# Patient Record
Sex: Female | Born: 1945 | Race: White | Hispanic: No | Marital: Married | Smoking: Never smoker
Health system: Southern US, Community
[De-identification: ages and names within clinical notes are randomized; demographics above are authoritative.]

---

## 2019-06-11 DIAGNOSIS — Z20822 Contact with and (suspected) exposure to covid-19: Secondary | ICD-10-CM | POA: Diagnosis not present

## 2019-06-15 DIAGNOSIS — Z20822 Contact with and (suspected) exposure to covid-19: Secondary | ICD-10-CM | POA: Diagnosis not present

## 2019-06-15 DIAGNOSIS — B349 Viral infection, unspecified: Secondary | ICD-10-CM | POA: Diagnosis not present

## 2019-06-20 DIAGNOSIS — Z20822 Contact with and (suspected) exposure to covid-19: Secondary | ICD-10-CM | POA: Diagnosis not present

## 2019-06-20 DIAGNOSIS — U071 COVID-19: Secondary | ICD-10-CM | POA: Diagnosis not present

## 2019-07-03 DIAGNOSIS — N1832 Chronic kidney disease, stage 3b: Secondary | ICD-10-CM | POA: Diagnosis not present

## 2019-09-05 DIAGNOSIS — Z8639 Personal history of other endocrine, nutritional and metabolic disease: Secondary | ICD-10-CM | POA: Diagnosis not present

## 2019-09-05 DIAGNOSIS — R69 Illness, unspecified: Secondary | ICD-10-CM | POA: Diagnosis not present

## 2019-09-05 DIAGNOSIS — I10 Essential (primary) hypertension: Secondary | ICD-10-CM | POA: Diagnosis not present

## 2019-09-05 DIAGNOSIS — R7303 Prediabetes: Secondary | ICD-10-CM | POA: Diagnosis not present

## 2019-09-05 DIAGNOSIS — Z862 Personal history of diseases of the blood and blood-forming organs and certain disorders involving the immune mechanism: Secondary | ICD-10-CM | POA: Diagnosis not present

## 2019-09-05 DIAGNOSIS — Z7189 Other specified counseling: Secondary | ICD-10-CM | POA: Diagnosis not present

## 2019-09-16 DIAGNOSIS — H524 Presbyopia: Secondary | ICD-10-CM | POA: Diagnosis not present

## 2019-09-18 DIAGNOSIS — Z862 Personal history of diseases of the blood and blood-forming organs and certain disorders involving the immune mechanism: Secondary | ICD-10-CM | POA: Diagnosis not present

## 2019-09-18 DIAGNOSIS — E559 Vitamin D deficiency, unspecified: Secondary | ICD-10-CM | POA: Diagnosis not present

## 2019-09-18 DIAGNOSIS — R69 Illness, unspecified: Secondary | ICD-10-CM | POA: Diagnosis not present

## 2019-09-18 DIAGNOSIS — R7303 Prediabetes: Secondary | ICD-10-CM | POA: Diagnosis not present

## 2019-09-18 DIAGNOSIS — Z7189 Other specified counseling: Secondary | ICD-10-CM | POA: Diagnosis not present

## 2019-09-18 DIAGNOSIS — Z136 Encounter for screening for cardiovascular disorders: Secondary | ICD-10-CM | POA: Diagnosis not present

## 2019-09-18 DIAGNOSIS — I10 Essential (primary) hypertension: Secondary | ICD-10-CM | POA: Diagnosis not present

## 2019-10-29 ENCOUNTER — Other Ambulatory Visit: Payer: Self-pay | Admitting: Family Medicine

## 2019-10-29 DIAGNOSIS — Z1231 Encounter for screening mammogram for malignant neoplasm of breast: Secondary | ICD-10-CM

## 2019-11-07 ENCOUNTER — Ambulatory Visit: Payer: Self-pay

## 2019-11-27 DIAGNOSIS — R69 Illness, unspecified: Secondary | ICD-10-CM | POA: Diagnosis not present

## 2019-12-18 DIAGNOSIS — S20212A Contusion of left front wall of thorax, initial encounter: Secondary | ICD-10-CM | POA: Diagnosis not present

## 2020-02-05 ENCOUNTER — Ambulatory Visit: Payer: Self-pay

## 2020-02-14 ENCOUNTER — Other Ambulatory Visit: Payer: Self-pay

## 2020-02-14 ENCOUNTER — Ambulatory Visit
Admission: RE | Admit: 2020-02-14 | Discharge: 2020-02-14 | Disposition: A | Discharge status: Home / Self Care | Payer: Medicare HMO | Source: Ambulatory Visit | Attending: Family Medicine | Admitting: Family Medicine

## 2020-02-14 DIAGNOSIS — Z1231 Encounter for screening mammogram for malignant neoplasm of breast: Secondary | ICD-10-CM | POA: Diagnosis not present

## 2020-02-25 DIAGNOSIS — R69 Illness, unspecified: Secondary | ICD-10-CM | POA: Diagnosis not present

## 2020-02-26 DIAGNOSIS — R7303 Prediabetes: Secondary | ICD-10-CM | POA: Diagnosis not present

## 2020-02-26 DIAGNOSIS — E785 Hyperlipidemia, unspecified: Secondary | ICD-10-CM | POA: Diagnosis not present

## 2020-04-02 DIAGNOSIS — R69 Illness, unspecified: Secondary | ICD-10-CM | POA: Diagnosis not present

## 2020-04-14 DIAGNOSIS — R69 Illness, unspecified: Secondary | ICD-10-CM | POA: Diagnosis not present

## 2020-04-14 DIAGNOSIS — I10 Essential (primary) hypertension: Secondary | ICD-10-CM | POA: Diagnosis not present

## 2020-04-14 DIAGNOSIS — R7303 Prediabetes: Secondary | ICD-10-CM | POA: Diagnosis not present

## 2020-08-28 DIAGNOSIS — R69 Illness, unspecified: Secondary | ICD-10-CM | POA: Diagnosis not present

## 2020-08-28 DIAGNOSIS — R7303 Prediabetes: Secondary | ICD-10-CM | POA: Diagnosis not present

## 2020-08-28 DIAGNOSIS — I10 Essential (primary) hypertension: Secondary | ICD-10-CM | POA: Diagnosis not present

## 2020-09-28 DIAGNOSIS — Z0000 Encounter for general adult medical examination without abnormal findings: Secondary | ICD-10-CM | POA: Diagnosis not present

## 2020-09-28 DIAGNOSIS — Z1389 Encounter for screening for other disorder: Secondary | ICD-10-CM | POA: Diagnosis not present

## 2020-09-29 ENCOUNTER — Other Ambulatory Visit: Payer: Self-pay | Admitting: Family Medicine

## 2020-09-29 DIAGNOSIS — Z1231 Encounter for screening mammogram for malignant neoplasm of breast: Secondary | ICD-10-CM

## 2020-09-29 DIAGNOSIS — I10 Essential (primary) hypertension: Secondary | ICD-10-CM | POA: Diagnosis not present

## 2020-09-29 DIAGNOSIS — R7303 Prediabetes: Secondary | ICD-10-CM | POA: Diagnosis not present

## 2020-09-29 DIAGNOSIS — E2839 Other primary ovarian failure: Secondary | ICD-10-CM

## 2020-10-06 DIAGNOSIS — Z1211 Encounter for screening for malignant neoplasm of colon: Secondary | ICD-10-CM | POA: Diagnosis not present

## 2020-10-06 DIAGNOSIS — R69 Illness, unspecified: Secondary | ICD-10-CM | POA: Diagnosis not present

## 2020-10-06 DIAGNOSIS — R7303 Prediabetes: Secondary | ICD-10-CM | POA: Diagnosis not present

## 2020-10-06 DIAGNOSIS — I10 Essential (primary) hypertension: Secondary | ICD-10-CM | POA: Diagnosis not present

## 2020-10-06 DIAGNOSIS — K219 Gastro-esophageal reflux disease without esophagitis: Secondary | ICD-10-CM | POA: Diagnosis not present

## 2021-02-15 ENCOUNTER — Ambulatory Visit: Payer: Medicare HMO

## 2021-02-16 DIAGNOSIS — M542 Cervicalgia: Secondary | ICD-10-CM | POA: Diagnosis not present

## 2021-02-16 DIAGNOSIS — T148XXA Other injury of unspecified body region, initial encounter: Secondary | ICD-10-CM | POA: Diagnosis not present

## 2021-02-16 DIAGNOSIS — Z6824 Body mass index (BMI) 24.0-24.9, adult: Secondary | ICD-10-CM | POA: Diagnosis not present

## 2021-03-19 ENCOUNTER — Ambulatory Visit
Admission: RE | Admit: 2021-03-19 | Discharge: 2021-03-19 | Disposition: A | Discharge status: Home / Self Care | Payer: Medicare HMO | Source: Ambulatory Visit | Attending: Family Medicine | Admitting: Family Medicine

## 2021-03-19 DIAGNOSIS — Z1231 Encounter for screening mammogram for malignant neoplasm of breast: Secondary | ICD-10-CM | POA: Diagnosis not present

## 2021-04-21 DIAGNOSIS — I10 Essential (primary) hypertension: Secondary | ICD-10-CM | POA: Diagnosis not present

## 2021-04-21 DIAGNOSIS — R7303 Prediabetes: Secondary | ICD-10-CM | POA: Diagnosis not present

## 2021-04-21 DIAGNOSIS — R69 Illness, unspecified: Secondary | ICD-10-CM | POA: Diagnosis not present

## 2021-04-21 DIAGNOSIS — K219 Gastro-esophageal reflux disease without esophagitis: Secondary | ICD-10-CM | POA: Diagnosis not present

## 2021-07-20 DIAGNOSIS — H1045 Other chronic allergic conjunctivitis: Secondary | ICD-10-CM | POA: Diagnosis not present

## 2021-07-20 DIAGNOSIS — H16223 Keratoconjunctivitis sicca, not specified as Sjogren's, bilateral: Secondary | ICD-10-CM | POA: Diagnosis not present

## 2021-07-29 ENCOUNTER — Ambulatory Visit
Admission: RE | Admit: 2021-07-29 | Discharge: 2021-07-29 | Disposition: A | Discharge status: Home / Self Care | Payer: Medicare HMO | Source: Ambulatory Visit | Attending: Family Medicine | Admitting: Family Medicine

## 2021-07-29 DIAGNOSIS — Z780 Asymptomatic menopausal state: Secondary | ICD-10-CM | POA: Diagnosis not present

## 2021-07-29 DIAGNOSIS — E2839 Other primary ovarian failure: Secondary | ICD-10-CM

## 2021-07-29 DIAGNOSIS — M8589 Other specified disorders of bone density and structure, multiple sites: Secondary | ICD-10-CM | POA: Diagnosis not present

## 2021-08-04 DIAGNOSIS — M8580 Other specified disorders of bone density and structure, unspecified site: Secondary | ICD-10-CM | POA: Diagnosis not present

## 2021-08-04 DIAGNOSIS — K219 Gastro-esophageal reflux disease without esophagitis: Secondary | ICD-10-CM | POA: Diagnosis not present

## 2021-09-01 IMAGING — MG DIGITAL SCREENING BILAT W/ TOMO W/ CAD
8 series · 8 of 24 positions shown · non-contrast
Comparison: None.
COMPARISON: None.

Addendum:
CLINICAL DATA: Screening.

EXAM:
DIGITAL SCREENING BILATERAL MAMMOGRAM WITH TOMO AND CAD

[R MLO synth-2D]
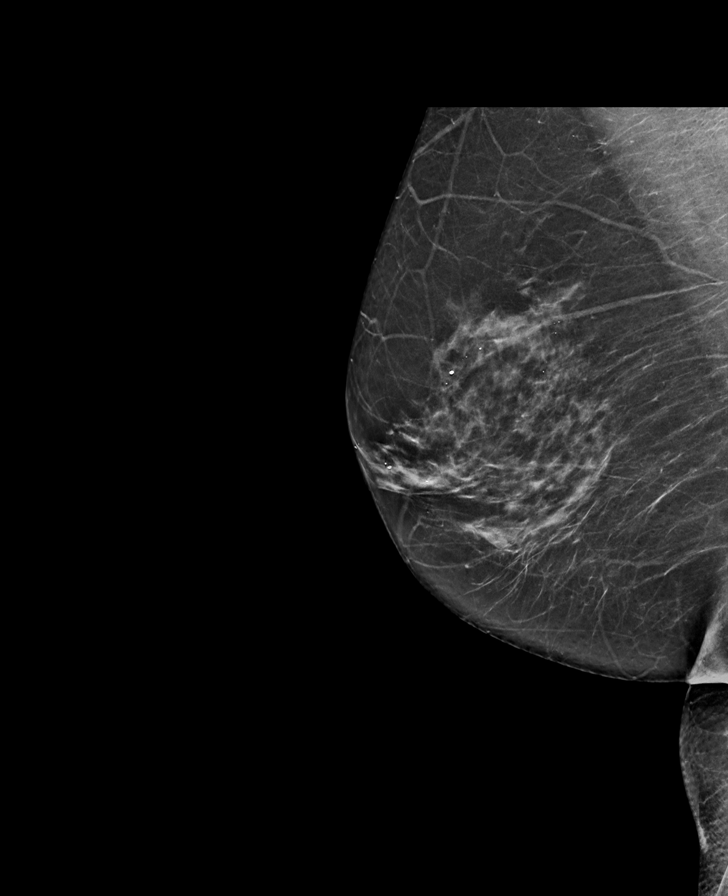

[L MLO synth-2D]
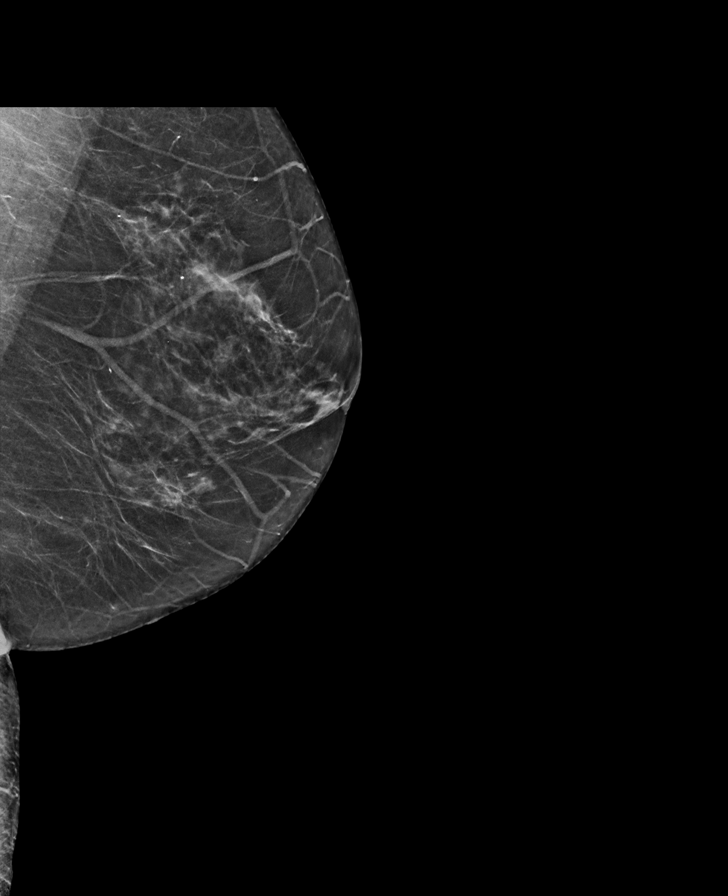

[L CC synth-2D]
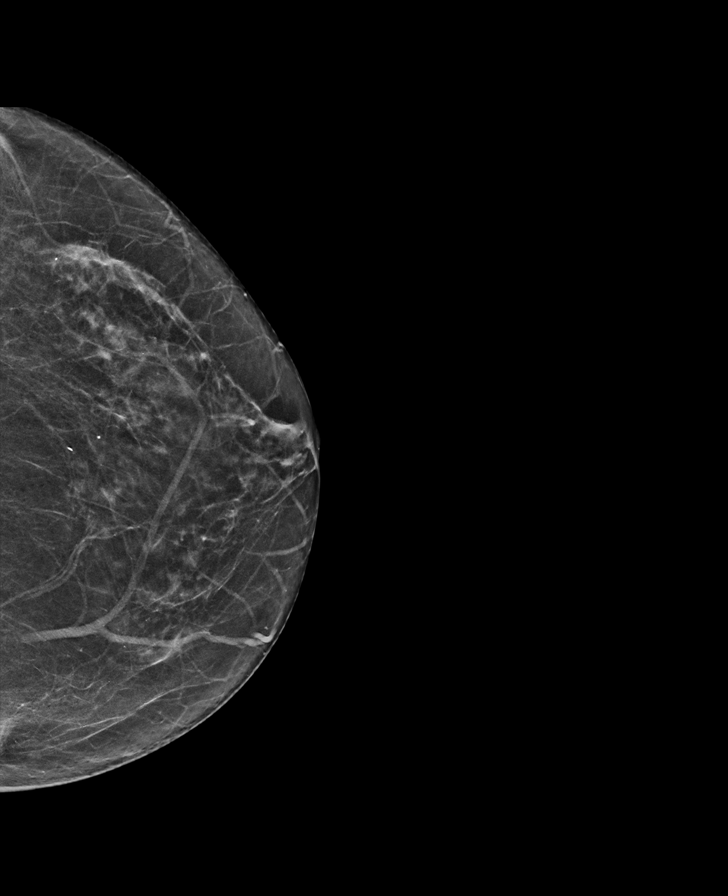

[R CC synth-2D]
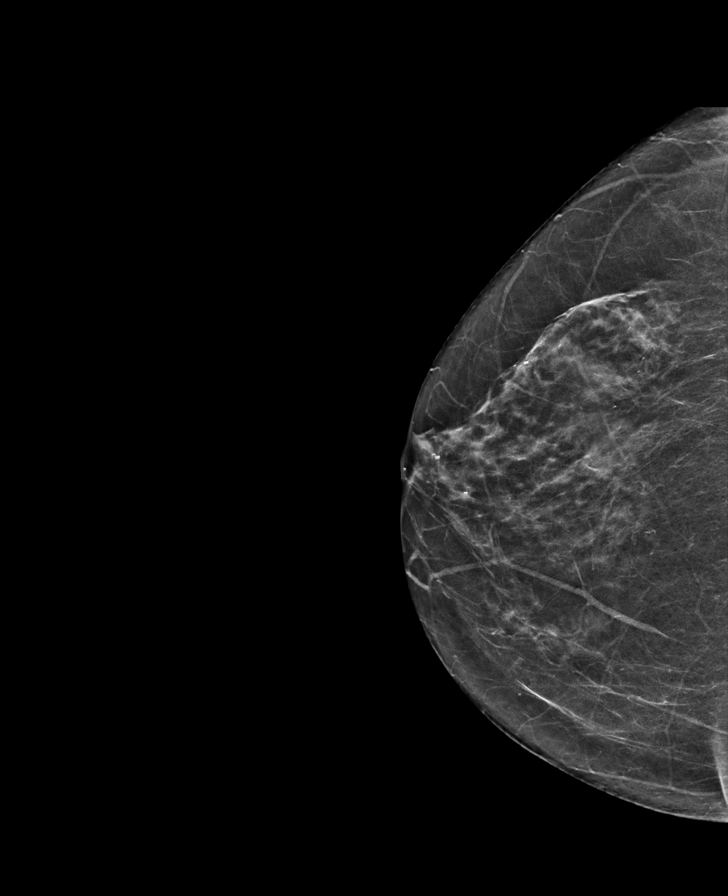

[L MLO tomo · tomo slice 33/66.0]
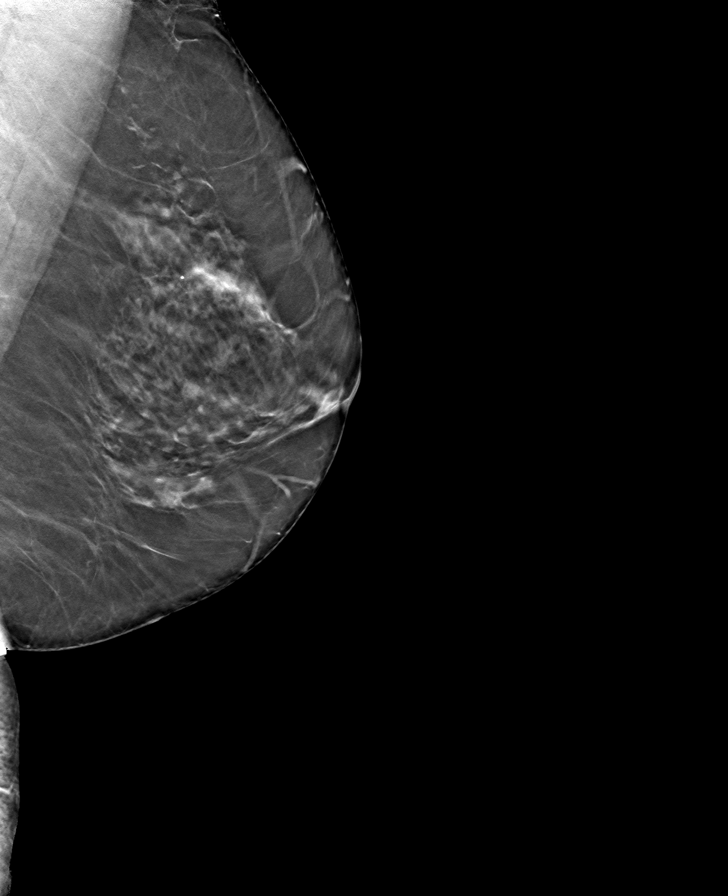

[L CC tomo · tomo slice 31/62.0]
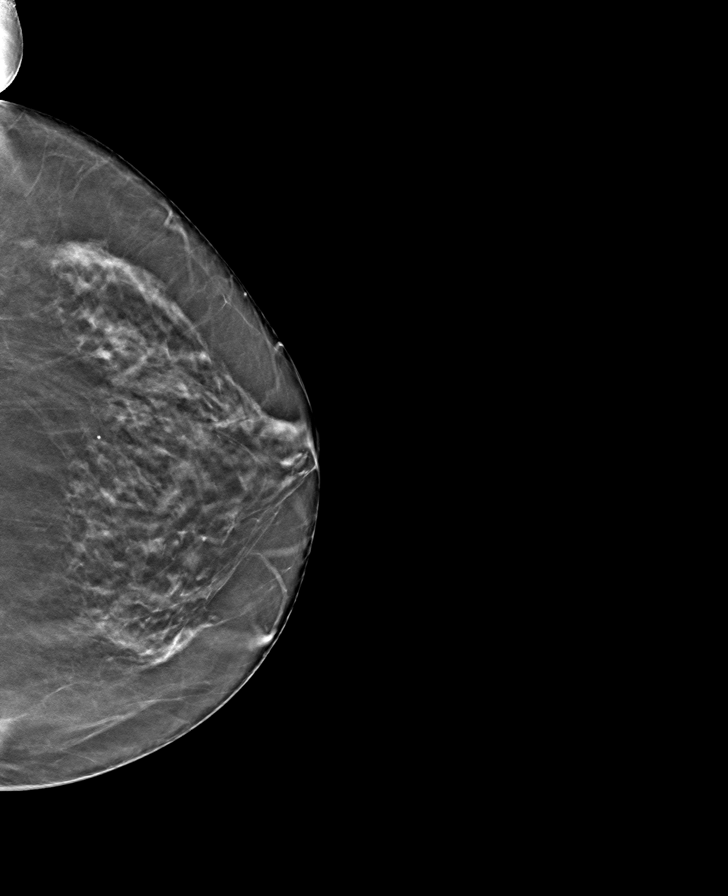

[R CC tomo · tomo slice 31/62.0]
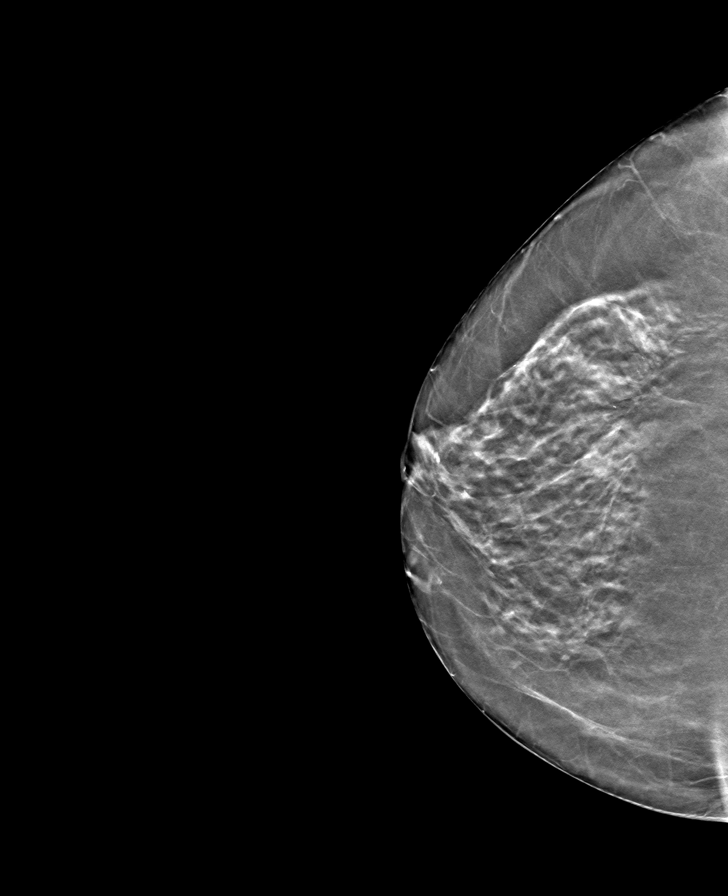

[R MLO tomo · tomo slice 37/72.0]
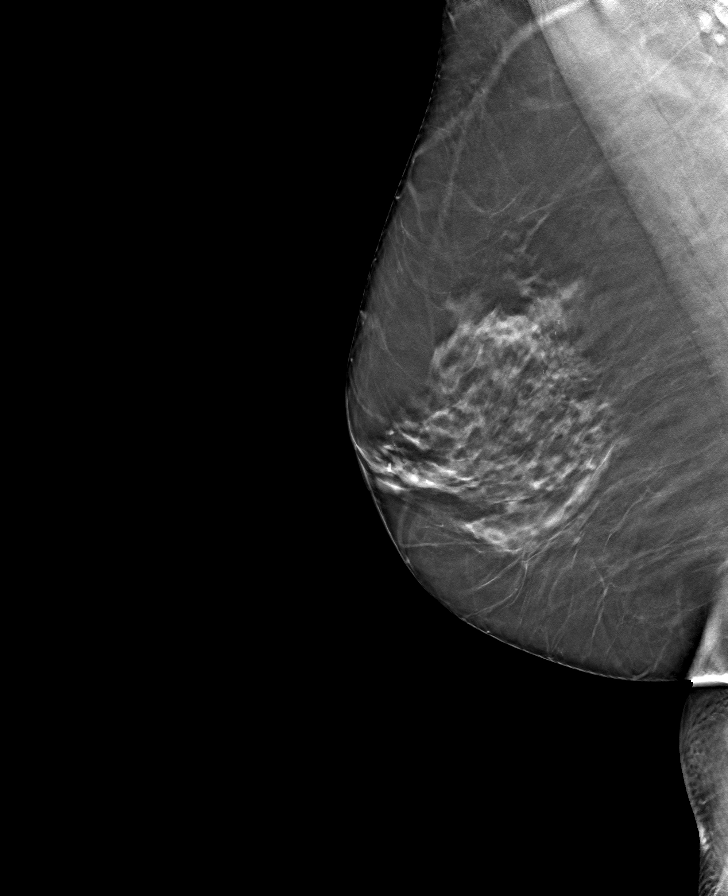

[8 of 24 positions shown; findings below may reference images not displayed]

ACR Breast Density Category b: There are scattered areas of
fibroglandular density.
FINDINGS: There are no findings suspicious for malignancy. Images were
processed with CAD.
IMPRESSION: No mammographic evidence of malignancy. A result letter of this
screening mammogram will be mailed directly to the patient.

RECOMMENDATION:
Screening mammogram in one year. (Code:Q2-V-KID)

BI-RADS CATEGORY  1: Negative.

ADDENDUM:
Prior outside mammograms from [REDACTED] dated 01/07/2019,
11/21/2016, and 11/07/2016 have become available for comparison.
When compared to these prior exams there has been no change in the
mammographic appearance of the bilateral breasts.

Recommend annual routine screening mammography.

BI-RADS category: 1: Negative.

*** End of Addendum ***
ACR Breast Density Category b: There are scattered areas of
fibroglandular density.
FINDINGS: There are no findings suspicious for malignancy. Images were
processed with CAD.
IMPRESSION: No mammographic evidence of malignancy. A result letter of this
screening mammogram will be mailed directly to the patient.

RECOMMENDATION:
Screening mammogram in one year. (Code:Q2-V-KID)

BI-RADS CATEGORY  1: Negative.

## 2021-10-07 DIAGNOSIS — K219 Gastro-esophageal reflux disease without esophagitis: Secondary | ICD-10-CM | POA: Diagnosis not present

## 2021-10-07 DIAGNOSIS — S161XXA Strain of muscle, fascia and tendon at neck level, initial encounter: Secondary | ICD-10-CM | POA: Diagnosis not present

## 2021-10-07 DIAGNOSIS — Z79899 Other long term (current) drug therapy: Secondary | ICD-10-CM | POA: Diagnosis not present

## 2021-10-07 DIAGNOSIS — I10 Essential (primary) hypertension: Secondary | ICD-10-CM | POA: Diagnosis not present

## 2021-10-07 DIAGNOSIS — M542 Cervicalgia: Secondary | ICD-10-CM | POA: Diagnosis not present

## 2021-10-07 DIAGNOSIS — R7303 Prediabetes: Secondary | ICD-10-CM | POA: Diagnosis not present

## 2021-10-07 DIAGNOSIS — E2839 Other primary ovarian failure: Secondary | ICD-10-CM | POA: Diagnosis not present

## 2021-10-07 DIAGNOSIS — E785 Hyperlipidemia, unspecified: Secondary | ICD-10-CM | POA: Diagnosis not present

## 2021-10-07 DIAGNOSIS — Z8639 Personal history of other endocrine, nutritional and metabolic disease: Secondary | ICD-10-CM | POA: Diagnosis not present

## 2021-10-07 DIAGNOSIS — R69 Illness, unspecified: Secondary | ICD-10-CM | POA: Diagnosis not present

## 2021-10-07 DIAGNOSIS — Z862 Personal history of diseases of the blood and blood-forming organs and certain disorders involving the immune mechanism: Secondary | ICD-10-CM | POA: Diagnosis not present

## 2021-10-15 DIAGNOSIS — E785 Hyperlipidemia, unspecified: Secondary | ICD-10-CM | POA: Diagnosis not present

## 2021-10-15 DIAGNOSIS — I10 Essential (primary) hypertension: Secondary | ICD-10-CM | POA: Diagnosis not present

## 2021-10-15 DIAGNOSIS — R7303 Prediabetes: Secondary | ICD-10-CM | POA: Diagnosis not present

## 2022-02-22 ENCOUNTER — Other Ambulatory Visit: Payer: Self-pay | Admitting: Family Medicine

## 2022-02-22 DIAGNOSIS — Z1231 Encounter for screening mammogram for malignant neoplasm of breast: Secondary | ICD-10-CM

## 2022-03-28 ENCOUNTER — Ambulatory Visit: Payer: Medicare HMO

## 2022-04-08 ENCOUNTER — Ambulatory Visit: Payer: Medicare HMO

## 2022-04-11 ENCOUNTER — Encounter (HOSPITAL_BASED_OUTPATIENT_CLINIC_OR_DEPARTMENT_OTHER): Payer: Self-pay | Admitting: Nurse Practitioner

## 2022-04-11 ENCOUNTER — Ambulatory Visit (INDEPENDENT_AMBULATORY_CARE_PROVIDER_SITE_OTHER): Payer: Medicare HMO | Admitting: Nurse Practitioner

## 2022-04-11 VITALS — BP 123/65 | HR 73 | Temp 97.7°F | Ht 63.5 in | Wt 146.0 lb

## 2022-04-11 DIAGNOSIS — K219 Gastro-esophageal reflux disease without esophagitis: Secondary | ICD-10-CM | POA: Diagnosis not present

## 2022-04-11 DIAGNOSIS — M8589 Other specified disorders of bone density and structure, multiple sites: Secondary | ICD-10-CM

## 2022-04-11 DIAGNOSIS — R7303 Prediabetes: Secondary | ICD-10-CM | POA: Diagnosis not present

## 2022-04-11 DIAGNOSIS — R69 Illness, unspecified: Secondary | ICD-10-CM | POA: Diagnosis not present

## 2022-04-11 DIAGNOSIS — Z0000 Encounter for general adult medical examination without abnormal findings: Secondary | ICD-10-CM | POA: Diagnosis not present

## 2022-04-11 DIAGNOSIS — F411 Generalized anxiety disorder: Secondary | ICD-10-CM

## 2022-04-11 DIAGNOSIS — I10 Essential (primary) hypertension: Secondary | ICD-10-CM | POA: Diagnosis not present

## 2022-04-11 MED ORDER — HYDROXYZINE HCL 50 MG PO TABS: 50.0000 mg | ORAL_TABLET | Freq: Every evening | ORAL | 3 refills | Status: DC | PRN

## 2022-04-11 NOTE — Progress Notes (Signed)
Tollie Eth, DNP, AGNP-c Primary Care & Sports Medicine [address]  [address] [place], [place] [address] [phone]  New patient visit   Patient: Marissa Padilla   DOB: 1945/12/31   76 y.o. Female  MRN: [id number] Visit Date: 04/11/2022  Patient Care Team: de [place], Buren Kos, MD as PCP - General (Family Medicine)  Today's Vitals   04/11/22 1001  BP: 123/65  Pulse: 73  Temp: 97.7 F (36.5 C)  SpO2: 99%  Weight: 146 lb (66.2 kg)  Height: 5' 3.5" (1.613 m)   Body mass index is 25.46 kg/m.   Today's healthcare provider: Tollie Eth, NP   No chief complaint on file.  Subjective    Kerigan Narvaez is a 76 y.o. female who presents today as a new patient to establish care.    Patient endorses the following concerns presently:  HTN No CP, palp, HA, ShOB. BP well controlled. No SE from medication.   Osteoporosis Thought about the prolia but the cost is over 300 every 6 months and is not an option right now.  Insomnia Has tried all over the counter medications. Not able to fall asleep or fall back asleep when waking up.   Anxiety Taking sertraline. Helping with symptoms.  Discussed option of hydroxyzine  Moved here from [place]- one son here, one son in [place], one daughter in [place].  COVID vaccines completed- booster next week No RSV, Shingles, Tetanus booster Has had Pneumonia  Flu vaccine- completed this season  History reviewed and reveals the following: History reviewed. No pertinent past medical history. History reviewed. No pertinent surgical history. Family Status  Relation Name Status   Neg Hx  (Not Specified)   Family History  Problem Relation Age of Onset   Breast cancer Neg Hx    Social History   Socioeconomic History   Marital status: Married    Spouse name: Not on file   Number of children: Not on file   Years of education: Not on file   Highest education level: Not on file  Occupational History   Not on  file  Tobacco Use   Smoking status: Not on file   Smokeless tobacco: Not on file  Substance and Sexual Activity   Alcohol use: Not on file   Drug use: Not on file   Sexual activity: Not on file  Other Topics Concern   Not on file  Social History Narrative   Not on file   Social Determinants of Health   Financial Resource Strain: Not on file  Food Insecurity: Not on file  Transportation Needs: Not on file  Physical Activity: Not on file  Stress: Not on file  Social Connections: Not on file   Outpatient Medications Prior to Visit  Medication Sig   amLODipine (NORVASC) 10 MG tablet Take 10 mg by mouth daily.   Fexofenadine HCl (ALLEGRA PO) Take by mouth.   losartan (COZAAR) 100 MG tablet Take 100 mg by mouth daily.   sertraline (ZOLOFT) 50 MG tablet Take 50 mg by mouth daily.   [DISCONTINUED] omeprazole (PRILOSEC) 10 MG capsule Take 10 mg by mouth every morning.   [DISCONTINUED] sertraline (ZOLOFT) 25 MG tablet Take 25 mg by mouth daily.   No facility-administered medications prior to visit.   Not on File  There is no immunization history on file for this patient.  Review of Systems All review of systems negative except what is listed in the HPI   Objective  BP 123/65 (BP Location: Left Arm, Patient Position: Sitting)   Pulse 73   Temp 97.7 F (36.5 C)   Ht 5' 3.5" (1.613 m)   Wt 146 lb (66.2 kg)   SpO2 99%   BMI 25.46 kg/m  Physical Exam Vitals and nursing note reviewed.  Constitutional:      General: She is not in acute distress.    Appearance: Normal appearance.  Eyes:     Extraocular Movements: Extraocular movements intact.     Conjunctiva/sclera: Conjunctivae normal.     Pupils: Pupils are equal, round, and reactive to light.  Neck:     Vascular: No carotid bruit.  Cardiovascular:     Rate and Rhythm: Normal rate and regular rhythm.     Pulses: Normal pulses.     Heart sounds: Normal heart sounds. No murmur heard. Pulmonary:     Effort: Pulmonary  effort is normal.     Breath sounds: Normal breath sounds. No wheezing.  Abdominal:     General: Bowel sounds are normal.     Palpations: Abdomen is soft.  Musculoskeletal:        General: Normal range of motion.     Cervical back: Normal range of motion.     Right lower leg: No edema.     Left lower leg: No edema.  Skin:    General: Skin is warm and dry.     Capillary Refill: Capillary refill takes less than 2 seconds.  Neurological:     General: No focal deficit present.     Mental Status: She is alert and oriented to person, place, and time.  Psychiatric:        Mood and Affect: Mood normal.        Behavior: Behavior normal.        Thought Content: Thought content normal.        Judgment: Judgment normal.     No results found for any visits on 04/11/22.  Assessment & Plan      Problem List Items Addressed This Visit     Primary hypertension    Chronic.  No alarm symptoms present at this time.  Goal blood pressure less than less than 140/90.  Refills have been provided today.  Labs today to check electrolytes and kidney function.  Recommend current treatment plan is effective, no change in therapy, orders and follow up as documented in EpicCare, reviewed diet, exercise and weight control, labs ordered and recent results reviewed with patient. We will make changes to plan of care as necessary based on lab results. Plan to follow-up in 25months.       Relevant Medications   amLODipine (NORVASC) 10 MG tablet   losartan (COZAAR) 100 MG tablet   Gastroesophageal reflux disease    Chronic. On daily omeprazole with good control. Not able to taper off but on low dose. Refills provided. Will monitor calcium and vitamin D      Pre-diabetes    Chronic. Diet controlled. Monitor.       Generalized anxiety disorder    Chronic. Well controlled. No alarm sx. Refills provided. No changes to plan.      Relevant Medications   sertraline (ZOLOFT) 50 MG tablet   Osteopenia of multiple  sites    Chronic. Prolia is not cost effective option for her. Fall preventions recommended. Monitor calcium and vitamin D. Unable to come off of omeprazole. Watch closely.       Other Visit Diagnoses     Encounter  for medical examination to establish care    -  Primary        Return in about 6 months (around 10/10/2022) for HTN and anxiety.      Tuere Nwosu, Sung Amabile, NP, DNP, AGNP-C Primary Care & Sports Medicine at [facility]   Health Maintenance Due Health Maintenance Topics with due status: Overdue     Topic Date Due   COVID-19 Vaccine Never done   Hepatitis C Screening Never done   DTaP/Tdap/Td Never done   Zoster Vaccines- Shingrix Never done   COLONOSCOPY (Pts 45-75yrs Insurance coverage will need to be confirmed) Never done   Pneumonia Vaccine 6+ Years old Never done   Medicare Annual Wellness (AWV) 09/28/2021   INFLUENZA VACCINE Never done    CPE Due  Labs Due

## 2022-04-11 NOTE — Patient Instructions (Addendum)
I will keep doing some research to see if we can find patient assistance or if there are options for Prolia vs. Reclast and see if we could get you started on one of these.   I will let you know what your labs show and will let you know if we need to make any changes.

## 2022-05-04 ENCOUNTER — Encounter (HOSPITAL_BASED_OUTPATIENT_CLINIC_OR_DEPARTMENT_OTHER): Payer: Self-pay | Admitting: Family Medicine

## 2022-05-04 ENCOUNTER — Ambulatory Visit (INDEPENDENT_AMBULATORY_CARE_PROVIDER_SITE_OTHER): Payer: Medicare HMO | Admitting: Family Medicine

## 2022-05-04 ENCOUNTER — Other Ambulatory Visit (HOSPITAL_BASED_OUTPATIENT_CLINIC_OR_DEPARTMENT_OTHER): Payer: Self-pay

## 2022-05-04 ENCOUNTER — Telehealth (HOSPITAL_BASED_OUTPATIENT_CLINIC_OR_DEPARTMENT_OTHER): Payer: Self-pay | Admitting: Family Medicine

## 2022-05-04 VITALS — BP 132/72 | HR 68 | Ht 63.5 in | Wt 147.7 lb

## 2022-05-04 DIAGNOSIS — H9202 Otalgia, left ear: Secondary | ICD-10-CM | POA: Insufficient documentation

## 2022-05-04 DIAGNOSIS — K219 Gastro-esophageal reflux disease without esophagitis: Secondary | ICD-10-CM

## 2022-05-04 MED ORDER — AMOXICILLIN-POT CLAVULANATE 875-125 MG PO TABS: 1.0000 | ORAL_TABLET | Freq: Two times a day (BID) | ORAL | 0 refills | Status: DC

## 2022-05-04 MED ORDER — OMEPRAZOLE 10 MG PO CPDR: 10.0000 mg | DELAYED_RELEASE_CAPSULE | Freq: Every morning | ORAL | 1 refills | Status: AC

## 2022-05-04 NOTE — Assessment & Plan Note (Signed)
Patient presents for evaluation of left ear pain.  She reports that this is been going on for about 4 days.  She does report similar issue several months ago.  She indicates having some decreased hearing which has been more of a chronic issue, no acute change in hearing with new symptoms.  She has not had any fever, chills, sweats.  She has not gave that she will occasionally have issues with allergic rhinitis for which she generally uses oral antihistamine.  Does not typically use intranasal sprays. On exam, patient is in no acute distress, vital signs stable, patient is afebrile.  Right external auditory canal with partial obstruction due to cerumen, normal-appearing tympanic membrane.  Left external auditory canal is clear.  Tympanic membrane with mild erythema and fluid noted.  No significant pain with manipulation of pinna.  No significant tenderness to palpation or masses noted on palpating around left ear or through left mastoid. Given symptoms and appearance on exam, suspect mild otitis media and recommend treating accordingly.  She denies any issues with amoxicillin or Augmentin in the past.  Will treat with a short 5-day course of Augmentin at this time.  Did discuss potential risk and side effects related to antibiotic therapy to be mindful of, particularly related to potential GI symptoms, loose stool, diarrhea. Additionally for any allergic symptoms, she could utilize intranasal steroid spray, intranasal saline spray to help with sinus irrigation and reduction of any inflammation within the sinuses

## 2022-05-04 NOTE — Telephone Encounter (Signed)
Pt called and stated she had forgot to tell dr. De Peru that she is needing a refill on medication  omeprazole (PRILOSEC) 10 MG capsule [[id number]]   Spoke to CMA about this refill. Please advise.

## 2022-05-04 NOTE — Patient Instructions (Signed)

## 2022-05-04 NOTE — Progress Notes (Signed)
    Procedures performed today:    None.  Independent interpretation of notes and tests performed by another provider:   None.  Brief History, Exam, Impression, and Recommendations:    BP 132/72 (BP Location: Right Arm, Patient Position: Sitting, Cuff Size: Large)   Pulse 68   Ht 5' 3.5" (1.613 m)   Wt 147 lb 11.2 oz (67 kg)   SpO2 100%   BMI 25.75 kg/m   Left ear pain Patient presents for evaluation of left ear pain.  She reports that this is been going on for about 4 days.  She does report similar issue several months ago.  She indicates having some decreased hearing which has been more of a chronic issue, no acute change in hearing with new symptoms.  She has not had any fever, chills, sweats.  She has not gave that she will occasionally have issues with allergic rhinitis for which she generally uses oral antihistamine.  Does not typically use intranasal sprays. On exam, patient is in no acute distress, vital signs stable, patient is afebrile.  Right external auditory canal with partial obstruction due to cerumen, normal-appearing tympanic membrane.  Left external auditory canal is clear.  Tympanic membrane with mild erythema and fluid noted.  No significant pain with manipulation of pinna.  No significant tenderness to palpation or masses noted on palpating around left ear or through left mastoid. Given symptoms and appearance on exam, suspect mild otitis media and recommend treating accordingly.  She denies any issues with amoxicillin or Augmentin in the past.  Will treat with a short 5-day course of Augmentin at this time.  Did discuss potential risk and side effects related to antibiotic therapy to be mindful of, particularly related to potential GI symptoms, loose stool, diarrhea. Additionally for any allergic symptoms, she could utilize intranasal steroid spray, intranasal saline spray to help with sinus irrigation and reduction of any inflammation within the sinuses  Patient  indicates that she is unsure regarding PCP follow-up because she has not determine if she is going to remain with the office here or continue seeing [facility].  Advised that I would recommend follow-up in about 2 to 3 months where that is with [place] in the office here or with [facility].  Encouraged her to make follow-up for this timeframe and to make decision in the near future   ___________________________________________ Stevee Valenta de [place], MD, ABFM, CAQSM Primary Care and Sports Medicine [facility]

## 2022-05-07 ENCOUNTER — Other Ambulatory Visit (HOSPITAL_BASED_OUTPATIENT_CLINIC_OR_DEPARTMENT_OTHER): Payer: Self-pay | Admitting: Nurse Practitioner

## 2022-05-07 DIAGNOSIS — F411 Generalized anxiety disorder: Secondary | ICD-10-CM

## 2022-05-11 ENCOUNTER — Ambulatory Visit
Admission: RE | Admit: 2022-05-11 | Discharge: 2022-05-11 | Disposition: A | Discharge status: Home / Self Care | Payer: Medicare HMO | Source: Ambulatory Visit | Attending: Family Medicine | Admitting: Family Medicine

## 2022-05-11 DIAGNOSIS — Z1231 Encounter for screening mammogram for malignant neoplasm of breast: Secondary | ICD-10-CM | POA: Diagnosis not present

## 2022-05-27 DIAGNOSIS — I10 Essential (primary) hypertension: Secondary | ICD-10-CM | POA: Insufficient documentation

## 2022-05-27 DIAGNOSIS — K219 Gastro-esophageal reflux disease without esophagitis: Secondary | ICD-10-CM | POA: Insufficient documentation

## 2022-05-27 DIAGNOSIS — F411 Generalized anxiety disorder: Secondary | ICD-10-CM | POA: Insufficient documentation

## 2022-05-27 DIAGNOSIS — M8589 Other specified disorders of bone density and structure, multiple sites: Secondary | ICD-10-CM | POA: Insufficient documentation

## 2022-05-27 DIAGNOSIS — R7303 Prediabetes: Secondary | ICD-10-CM | POA: Insufficient documentation

## 2022-05-27 NOTE — Assessment & Plan Note (Signed)
Chronic. Diet controlled. Monitor.

## 2022-05-27 NOTE — Assessment & Plan Note (Signed)
Chronic. Well controlled. No alarm sx. Refills provided. No changes to plan.

## 2022-05-27 NOTE — Assessment & Plan Note (Signed)
Chronic. Prolia is not cost effective option for her. Fall preventions recommended. Monitor calcium and vitamin D. Unable to come off of omeprazole. Watch closely.

## 2022-05-27 NOTE — Assessment & Plan Note (Signed)
Chronic. On daily omeprazole with good control. Not able to taper off but on low dose. Refills provided. Will monitor calcium and vitamin D

## 2022-05-27 NOTE — Assessment & Plan Note (Signed)
Chronic.  No alarm symptoms present at this time.  Goal blood pressure less than less than 140/90.  Refills have been provided today.  Labs today to check electrolytes and kidney function.  Recommend current treatment plan is effective, no change in therapy, orders and follow up as documented in EpicCare, reviewed diet, exercise and weight control, labs ordered and recent results reviewed with patient. We will make changes to plan of care as necessary based on lab results. Plan to follow-up in 31months.

## 2022-06-13 ENCOUNTER — Ambulatory Visit (INDEPENDENT_AMBULATORY_CARE_PROVIDER_SITE_OTHER): Payer: Medicare HMO | Admitting: Nurse Practitioner

## 2022-06-13 ENCOUNTER — Encounter: Payer: Self-pay | Admitting: Nurse Practitioner

## 2022-06-13 VITALS — BP 122/82 | HR 82 | Temp 98.3°F | Wt 146.0 lb

## 2022-06-13 DIAGNOSIS — I10 Essential (primary) hypertension: Secondary | ICD-10-CM

## 2022-06-13 DIAGNOSIS — R7303 Prediabetes: Secondary | ICD-10-CM | POA: Diagnosis not present

## 2022-06-13 DIAGNOSIS — R69 Illness, unspecified: Secondary | ICD-10-CM | POA: Diagnosis not present

## 2022-06-13 DIAGNOSIS — M8589 Other specified disorders of bone density and structure, multiple sites: Secondary | ICD-10-CM

## 2022-06-13 DIAGNOSIS — E559 Vitamin D deficiency, unspecified: Secondary | ICD-10-CM

## 2022-06-13 DIAGNOSIS — F411 Generalized anxiety disorder: Secondary | ICD-10-CM

## 2022-06-13 LAB — LIPID PANEL

## 2022-06-13 MED ORDER — LOSARTAN POTASSIUM 100 MG PO TABS: 100.0000 mg | ORAL_TABLET | Freq: Every day | ORAL | 3 refills | Status: DC

## 2022-06-13 MED ORDER — AMLODIPINE BESYLATE 10 MG PO TABS: 10.0000 mg | ORAL_TABLET | Freq: Every day | ORAL | 3 refills | Status: AC

## 2022-06-13 MED ORDER — SERTRALINE HCL 50 MG PO TABS: 50.0000 mg | ORAL_TABLET | Freq: Every day | ORAL | 3 refills | Status: AC

## 2022-06-13 NOTE — Assessment & Plan Note (Signed)
Chronic. Stable with no acute concerns. Refill on sertraline sent to pharmacy. Recommend follow-up if symptoms appear to be worsening or new symptoms present.

## 2022-06-13 NOTE — Assessment & Plan Note (Signed)
Chronic. No alarm sx. Diet and exercise managed. Will obtain labs today for evaluation and monitoring. Diet recommendations include low carb and low saturated fat options, avoid skipping meals. Exercise goal at least 150 minutes of moderate intensity exercise a week. Start slow and build if not currently exercising. F/U in 6 months or sooner if needed.

## 2022-06-13 NOTE — Assessment & Plan Note (Signed)
Chronic. She is doing well on her current regimen and her blood pressures are within goal. No alarm sx present at this time. She is in need of fasting labs today, these have been ordered. She is also in need of refills for losartan and amlodipine, these have been sent. Will monitor labs and make changes as necessary based on findings. F/U in 6 months or sooner if needed.

## 2022-06-13 NOTE — Progress Notes (Signed)
Worthy Keeler, DNP, AGNP-c [facility]  [address] [place], [place] [address] [place] and/or Follow-Up Visit  Blood pressure 122/82, pulse 82, temperature 98.3 F (36.8 C), weight 146 lb (66.2 kg).    Marissa Padilla is a 77 y.o. year old female presenting today for evaluation and management of the following: HTN [name] tells me she is doing well with her blood pressures. Her at home readings are in the low 1202's/80's. She is not having chest pain, shortness of breath, palpitations, or dizziness. No headaches. She is taking her medication as prescribed without side effects. She is in need of refills and labs today. She reports no other concerns.  Mood She tells me her mood is stable on sertraline. She is in need of refills today. She has not had any acute exacerbations or concerns.   All ROS negative with exception of what is listed above.   PHYSICAL EXAM Physical Exam Vitals and nursing note reviewed.  Constitutional:      Appearance: Normal appearance.  HENT:     Head: Normocephalic.  Eyes:     Extraocular Movements: Extraocular movements intact.     Pupils: Pupils are equal, round, and reactive to light.  Neck:     Vascular: No carotid bruit.  Cardiovascular:     Rate and Rhythm: Normal rate and regular rhythm.     Pulses: Normal pulses.     Heart sounds: Normal heart sounds.  Pulmonary:     Effort: Pulmonary effort is normal.     Breath sounds: Normal breath sounds.  Musculoskeletal:     Cervical back: Normal range of motion.     Right lower leg: No edema.     Left lower leg: No edema.  Skin:    General: Skin is warm and dry.     Capillary Refill: Capillary refill takes less than 2 seconds.  Neurological:     Mental Status: She is alert.  Psychiatric:        Mood and Affect: Mood normal.        Behavior: Behavior normal.        Thought Content: Thought content normal.        Judgment: Judgment  normal.     PLAN Problem List Items Addressed This Visit     Primary hypertension - Primary    Chronic. She is doing well on her current regimen and her blood pressures are within goal. No alarm sx present at this time. She is in need of fasting labs today, these have been ordered. She is also in need of refills for losartan and amlodipine, these have been sent. Will monitor labs and make changes as necessary based on findings. F/U in 6 months or sooner if needed.      Relevant Medications   amLODipine (NORVASC) 10 MG tablet   losartan (COZAAR) 100 MG tablet   Other Relevant Orders   CBC with Differential/Platelet   Comprehensive metabolic panel   Lipid panel   Hemoglobin A1c   Pre-diabetes    Chronic. No alarm sx. Diet and exercise managed. Will obtain labs today for evaluation and monitoring. Diet recommendations include low carb and low saturated fat options, avoid skipping meals. Exercise goal at least 150 minutes of moderate intensity exercise a week. Start slow and build if not currently exercising. F/U in 6 months or sooner if needed.       Relevant Orders   CBC with Differential/Platelet   Lipid panel  Hemoglobin A1c   Generalized anxiety disorder    Chronic. Stable with no acute concerns. Refill on sertraline sent to pharmacy. Recommend follow-up if symptoms appear to be worsening or new symptoms present.       Relevant Medications   sertraline (ZOLOFT) 50 MG tablet   Osteopenia of multiple sites    Recheck vitamin D levels today.       Relevant Orders   Comprehensive metabolic panel   VITAMIN D 25 Hydroxy (Vit-D Deficiency, Fractures)   Other Visit Diagnoses     Vitamin D deficiency       Relevant Orders   VITAMIN D 25 Hydroxy (Vit-D Deficiency, Fractures)       Return in about 6 months (around 12/12/2022) for Chronic F/U.   Worthy Keeler, DNP, AGNP-c 06/13/2022  9:06 AM

## 2022-06-13 NOTE — Assessment & Plan Note (Signed)
Recheck vitamin D levels today. 

## 2022-06-14 LAB — CBC WITH DIFFERENTIAL/PLATELET
Basophils Absolute: 0 10*3/uL (ref 0.0–0.2)
Basos: 1 %
EOS (ABSOLUTE): 0.2 10*3/uL (ref 0.0–0.4)
Eos: 3 %
Hematocrit: 38.1 % (ref 34.0–46.6)
Hemoglobin: 12.5 g/dL (ref 11.1–15.9)
Immature Grans (Abs): 0 10*3/uL (ref 0.0–0.1)
Immature Granulocytes: 0 %
Lymphocytes Absolute: 1.8 10*3/uL (ref 0.7–3.1)
Lymphs: 25 %
MCH: 28.9 pg (ref 26.6–33.0)
MCHC: 32.8 g/dL (ref 31.5–35.7)
MCV: 88 fL (ref 79–97)
Monocytes Absolute: 0.3 10*3/uL (ref 0.1–0.9)
Monocytes: 4 %
Neutrophils Absolute: 4.8 10*3/uL (ref 1.4–7.0)
Neutrophils: 67 %
Platelets: 229 10*3/uL (ref 150–450)
RBC: 4.33 x10E6/uL (ref 3.77–5.28)
RDW: 13.5 % (ref 11.7–15.4)
WBC: 7.1 10*3/uL (ref 3.4–10.8)

## 2022-06-14 LAB — COMPREHENSIVE METABOLIC PANEL
ALT: 12 IU/L (ref 0–32)
AST: 18 IU/L (ref 0–40)
Albumin/Globulin Ratio: 1.6 (ref 1.2–2.2)
Albumin: 4.5 g/dL (ref 3.8–4.8)
Alkaline Phosphatase: 91 IU/L (ref 44–121)
BUN/Creatinine Ratio: 19 (ref 12–28)
BUN: 24 mg/dL (ref 8–27)
Bilirubin Total: 0.2 mg/dL (ref 0.0–1.2)
CO2: 22 mmol/L (ref 20–29)
Calcium: 9.6 mg/dL (ref 8.7–10.3)
Chloride: 105 mmol/L (ref 96–106)
Creatinine, Ser: 1.28 mg/dL — ABNORMAL HIGH (ref 0.57–1.00)
Globulin, Total: 2.8 g/dL (ref 1.5–4.5)
Glucose: 147 mg/dL — ABNORMAL HIGH (ref 70–99)
Potassium: 4.7 mmol/L (ref 3.5–5.2)
Sodium: 140 mmol/L (ref 134–144)
Total Protein: 7.3 g/dL (ref 6.0–8.5)
eGFR: 44 mL/min/{1.73_m2} — ABNORMAL LOW (ref >59)

## 2022-06-14 LAB — LIPID PANEL
Chol/HDL Ratio: 2.9 ratio (ref 0.0–4.4)
Cholesterol, Total: 217 mg/dL — ABNORMAL HIGH (ref 100–199)
HDL: 75 mg/dL (ref >39)
LDL Chol Calc (NIH): 129 mg/dL — ABNORMAL HIGH (ref 0–99)
Triglycerides: 75 mg/dL (ref 0–149)
VLDL Cholesterol Cal: 13 mg/dL (ref 5–40)

## 2022-06-14 LAB — HEMOGLOBIN A1C
Est. average glucose Bld gHb Est-mCnc: 134 mg/dL
Hgb A1c MFr Bld: 6.3 % — ABNORMAL HIGH (ref 4.8–5.6)

## 2022-06-14 LAB — VITAMIN D 25 HYDROXY (VIT D DEFICIENCY, FRACTURES): Vit D, 25-Hydroxy: 25.5 ng/mL — ABNORMAL LOW (ref 30.0–100.0)

## 2022-08-02 DIAGNOSIS — Z6825 Body mass index (BMI) 25.0-25.9, adult: Secondary | ICD-10-CM | POA: Diagnosis not present

## 2022-08-02 DIAGNOSIS — G5752 Tarsal tunnel syndrome, left lower limb: Secondary | ICD-10-CM | POA: Diagnosis not present

## 2022-08-02 DIAGNOSIS — I809 Phlebitis and thrombophlebitis of unspecified site: Secondary | ICD-10-CM | POA: Diagnosis not present

## 2022-08-31 ENCOUNTER — Telehealth: Payer: Self-pay | Admitting: Nurse Practitioner

## 2022-08-31 NOTE — Telephone Encounter (Signed)
Called patient to schedule Medicare Annual Wellness Visit (AWV). Left message for patient to call back and schedule Medicare Annual Wellness Visit (AWV).  Last date of AWV: awvi 05/30/20 per [facility]   Please schedule an appointment at any time with [facility].  If any questions, please contact me at [phone].  Thank you ,  Barkley Boards AWV direct phone # [phone]

## 2022-10-27 ENCOUNTER — Other Ambulatory Visit (HOSPITAL_BASED_OUTPATIENT_CLINIC_OR_DEPARTMENT_OTHER): Payer: Self-pay | Admitting: Family Medicine

## 2022-10-27 DIAGNOSIS — K219 Gastro-esophageal reflux disease without esophagitis: Secondary | ICD-10-CM

## 2022-11-16 DIAGNOSIS — N1831 Chronic kidney disease, stage 3a: Secondary | ICD-10-CM | POA: Diagnosis not present

## 2022-11-16 DIAGNOSIS — K219 Gastro-esophageal reflux disease without esophagitis: Secondary | ICD-10-CM | POA: Diagnosis not present

## 2022-11-16 DIAGNOSIS — Z6825 Body mass index (BMI) 25.0-25.9, adult: Secondary | ICD-10-CM | POA: Diagnosis not present

## 2022-11-16 DIAGNOSIS — R7303 Prediabetes: Secondary | ICD-10-CM | POA: Diagnosis not present

## 2022-11-16 DIAGNOSIS — F411 Generalized anxiety disorder: Secondary | ICD-10-CM | POA: Diagnosis not present

## 2022-11-16 DIAGNOSIS — E559 Vitamin D deficiency, unspecified: Secondary | ICD-10-CM | POA: Diagnosis not present

## 2022-11-16 DIAGNOSIS — E2839 Other primary ovarian failure: Secondary | ICD-10-CM | POA: Diagnosis not present

## 2022-11-16 DIAGNOSIS — I10 Essential (primary) hypertension: Secondary | ICD-10-CM | POA: Diagnosis not present

## 2022-11-16 DIAGNOSIS — M85851 Other specified disorders of bone density and structure, right thigh: Secondary | ICD-10-CM | POA: Diagnosis not present

## 2022-11-16 DIAGNOSIS — E785 Hyperlipidemia, unspecified: Secondary | ICD-10-CM | POA: Diagnosis not present

## 2022-12-13 DIAGNOSIS — M25561 Pain in right knee: Secondary | ICD-10-CM | POA: Diagnosis not present

## 2023-01-16 ENCOUNTER — Telehealth: Payer: Self-pay | Admitting: Nurse Practitioner

## 2023-01-16 NOTE — Telephone Encounter (Signed)
Spoke with patient to schedule awv  She stated she has changed pcp

## 2023-04-20 ENCOUNTER — Other Ambulatory Visit: Payer: Self-pay | Admitting: Nurse Practitioner

## 2023-04-20 DIAGNOSIS — Z1231 Encounter for screening mammogram for malignant neoplasm of breast: Secondary | ICD-10-CM

## 2023-05-30 ENCOUNTER — Other Ambulatory Visit: Payer: Self-pay | Admitting: Nurse Practitioner

## 2023-05-30 DIAGNOSIS — I10 Essential (primary) hypertension: Secondary | ICD-10-CM

## 2023-06-07 ENCOUNTER — Ambulatory Visit
Admission: RE | Admit: 2023-06-07 | Discharge: 2023-06-07 | Disposition: A | Discharge status: Home / Self Care | Payer: Medicare HMO | Source: Ambulatory Visit | Attending: Nurse Practitioner | Admitting: Nurse Practitioner

## 2023-06-07 DIAGNOSIS — Z1231 Encounter for screening mammogram for malignant neoplasm of breast: Secondary | ICD-10-CM

## 2023-06-23 ENCOUNTER — Other Ambulatory Visit: Payer: Self-pay | Admitting: Nurse Practitioner

## 2023-06-23 DIAGNOSIS — F411 Generalized anxiety disorder: Secondary | ICD-10-CM

## 2023-06-27 ENCOUNTER — Other Ambulatory Visit: Payer: Self-pay | Admitting: Family Medicine

## 2023-06-27 DIAGNOSIS — M85851 Other specified disorders of bone density and structure, right thigh: Secondary | ICD-10-CM

## 2023-08-29 DIAGNOSIS — M25562 Pain in left knee: Secondary | ICD-10-CM | POA: Diagnosis not present

## 2023-08-29 DIAGNOSIS — M1712 Unilateral primary osteoarthritis, left knee: Secondary | ICD-10-CM | POA: Diagnosis not present

## 2023-09-27 DIAGNOSIS — M1711 Unilateral primary osteoarthritis, right knee: Secondary | ICD-10-CM | POA: Diagnosis not present

## 2023-10-06 ENCOUNTER — Other Ambulatory Visit: Payer: Self-pay | Admitting: Nurse Practitioner

## 2023-10-06 DIAGNOSIS — I10 Essential (primary) hypertension: Secondary | ICD-10-CM

## 2023-11-13 DIAGNOSIS — K219 Gastro-esophageal reflux disease without esophagitis: Secondary | ICD-10-CM | POA: Diagnosis not present

## 2023-11-13 DIAGNOSIS — M6289 Other specified disorders of muscle: Secondary | ICD-10-CM | POA: Diagnosis not present

## 2023-11-30 ENCOUNTER — Other Ambulatory Visit

## 2024-01-09 DIAGNOSIS — M85851 Other specified disorders of bone density and structure, right thigh: Secondary | ICD-10-CM | POA: Diagnosis not present

## 2024-01-09 DIAGNOSIS — K219 Gastro-esophageal reflux disease without esophagitis: Secondary | ICD-10-CM | POA: Diagnosis not present

## 2024-01-09 DIAGNOSIS — N1831 Chronic kidney disease, stage 3a: Secondary | ICD-10-CM | POA: Diagnosis not present

## 2024-01-09 DIAGNOSIS — R7303 Prediabetes: Secondary | ICD-10-CM | POA: Diagnosis not present

## 2024-01-09 DIAGNOSIS — Z6826 Body mass index (BMI) 26.0-26.9, adult: Secondary | ICD-10-CM | POA: Diagnosis not present

## 2024-01-09 DIAGNOSIS — I10 Essential (primary) hypertension: Secondary | ICD-10-CM | POA: Diagnosis not present

## 2024-01-09 DIAGNOSIS — E559 Vitamin D deficiency, unspecified: Secondary | ICD-10-CM | POA: Diagnosis not present

## 2024-01-09 DIAGNOSIS — E785 Hyperlipidemia, unspecified: Secondary | ICD-10-CM | POA: Diagnosis not present

## 2024-02-13 DIAGNOSIS — M1711 Unilateral primary osteoarthritis, right knee: Secondary | ICD-10-CM | POA: Diagnosis not present

## 2024-03-06 ENCOUNTER — Ambulatory Visit (HOSPITAL_BASED_OUTPATIENT_CLINIC_OR_DEPARTMENT_OTHER)
Admission: RE | Admit: 2024-03-06 | Discharge: 2024-03-06 | Disposition: A | Discharge status: Home / Self Care | Source: Ambulatory Visit | Attending: Family Medicine | Admitting: Family Medicine

## 2024-03-06 DIAGNOSIS — M85851 Other specified disorders of bone density and structure, right thigh: Secondary | ICD-10-CM | POA: Insufficient documentation

## 2024-03-06 DIAGNOSIS — M810 Age-related osteoporosis without current pathological fracture: Secondary | ICD-10-CM | POA: Diagnosis not present

## 2024-03-13 ENCOUNTER — Other Ambulatory Visit: Payer: Medicare HMO

## 2024-03-22 DIAGNOSIS — M810 Age-related osteoporosis without current pathological fracture: Secondary | ICD-10-CM | POA: Diagnosis not present

## 2024-03-22 DIAGNOSIS — K219 Gastro-esophageal reflux disease without esophagitis: Secondary | ICD-10-CM | POA: Diagnosis not present

## 2024-03-22 DIAGNOSIS — Z23 Encounter for immunization: Secondary | ICD-10-CM | POA: Diagnosis not present

## 2024-04-10 DIAGNOSIS — M810 Age-related osteoporosis without current pathological fracture: Secondary | ICD-10-CM | POA: Diagnosis not present

## 2024-05-14 ENCOUNTER — Other Ambulatory Visit: Payer: Self-pay | Admitting: Family Medicine

## 2024-05-14 DIAGNOSIS — Z1231 Encounter for screening mammogram for malignant neoplasm of breast: Secondary | ICD-10-CM

## 2024-06-10 ENCOUNTER — Ambulatory Visit

## 2024-06-19 ENCOUNTER — Ambulatory Visit
Admission: RE | Admit: 2024-06-19 | Discharge: 2024-06-19 | Disposition: A | Source: Ambulatory Visit | Attending: Family Medicine | Admitting: Family Medicine

## 2024-06-19 DIAGNOSIS — Z1231 Encounter for screening mammogram for malignant neoplasm of breast: Secondary | ICD-10-CM
